# Patient Record
Sex: Male | Born: 1950 | Race: White | Hispanic: No | Marital: Married | Smoking: Former smoker
Health system: Southern US, Community
[De-identification: ages and names within clinical notes are randomized; demographics above are authoritative.]

## PROBLEM LIST (undated history)

## (undated) DIAGNOSIS — K219 Gastro-esophageal reflux disease without esophagitis: Secondary | ICD-10-CM

## (undated) DIAGNOSIS — M1990 Unspecified osteoarthritis, unspecified site: Secondary | ICD-10-CM

## (undated) HISTORY — PX: ANKLE SURGERY: SHX546

## (undated) HISTORY — PX: OTHER SURGICAL HISTORY: SHX169

---

## 2010-01-03 ENCOUNTER — Ambulatory Visit: Payer: Self-pay | Admitting: Internal Medicine

## 2015-01-07 DIAGNOSIS — J302 Other seasonal allergic rhinitis: Secondary | ICD-10-CM | POA: Insufficient documentation

## 2015-01-07 DIAGNOSIS — K644 Residual hemorrhoidal skin tags: Secondary | ICD-10-CM | POA: Insufficient documentation

## 2015-02-25 ENCOUNTER — Emergency Department
Admission: EM | Admit: 2015-02-25 | Discharge: 2015-02-25 | Disposition: A | Discharge status: Home / Self Care | Payer: Federal, State, Local not specified - PPO | Attending: Emergency Medicine | Admitting: Emergency Medicine

## 2015-02-25 ENCOUNTER — Emergency Department: Payer: Federal, State, Local not specified - PPO

## 2015-02-25 ENCOUNTER — Encounter: Payer: Self-pay | Admitting: Emergency Medicine

## 2015-02-25 DIAGNOSIS — S92002A Unspecified fracture of left calcaneus, initial encounter for closed fracture: Secondary | ICD-10-CM | POA: Diagnosis not present

## 2015-02-25 DIAGNOSIS — Y998 Other external cause status: Secondary | ICD-10-CM | POA: Diagnosis not present

## 2015-02-25 DIAGNOSIS — W11XXXA Fall on and from ladder, initial encounter: Secondary | ICD-10-CM | POA: Diagnosis not present

## 2015-02-25 DIAGNOSIS — Y9289 Other specified places as the place of occurrence of the external cause: Secondary | ICD-10-CM | POA: Diagnosis not present

## 2015-02-25 DIAGNOSIS — S39012A Strain of muscle, fascia and tendon of lower back, initial encounter: Secondary | ICD-10-CM | POA: Insufficient documentation

## 2015-02-25 DIAGNOSIS — S99912A Unspecified injury of left ankle, initial encounter: Secondary | ICD-10-CM | POA: Diagnosis present

## 2015-02-25 DIAGNOSIS — Y9389 Activity, other specified: Secondary | ICD-10-CM | POA: Diagnosis not present

## 2015-02-25 DIAGNOSIS — Z880 Allergy status to penicillin: Secondary | ICD-10-CM | POA: Diagnosis not present

## 2015-02-25 MED ORDER — NAPROXEN 500 MG PO TABS: 500.0000 mg | ORAL_TABLET | Freq: Two times a day (BID) | ORAL | Status: DC

## 2015-02-25 MED ORDER — CYCLOBENZAPRINE HCL 10 MG PO TABS: 10.0000 mg | ORAL_TABLET | Freq: Three times a day (TID) | ORAL | Status: DC | PRN

## 2015-02-25 MED ORDER — OXYCODONE-ACETAMINOPHEN 5-325 MG PO TABS: 1.0000 | ORAL_TABLET | Freq: Four times a day (QID) | ORAL | Status: DC | PRN

## 2015-02-25 MED ORDER — OXYCODONE-ACETAMINOPHEN 5-325 MG PO TABS
1.0000 | ORAL_TABLET | Freq: Once | ORAL | Status: AC
  Administered 2015-02-25: 1 via ORAL
  Filled 2015-02-25: qty 1

## 2015-02-25 NOTE — ED Notes (Signed)
Patient discharged to home per MD order. Patient in stable condition, and deemed medically cleared by ED provider for discharge. Discharge instructions reviewed with patient/family using "Teach Back"; verbalized understanding of medication education and administration, and information about follow-up care. Denies further concerns. ° °

## 2015-02-25 NOTE — ED Provider Notes (Signed)
[facility] Emergency Department Provider Note  ____________________________________________  Time seen: Approximately 5:19 PM  I have reviewed the triage vital signs and the nursing notes.   HISTORY  Chief Complaint Fall    HPI Edward Richards is a 64 y.o. male who presents to the emergency department complaining of left ankle pain and left lower back pain status post a fall from a ladder. He states that he was approximately 10 feet up when he fell landing on his left ankle. He states that he then "sat down hard". He states that he is unable to bear weight to the left ankle and the pain is sharp. He has noticed swelling to the medial aspect of the ankle. He states that his lower back pain is best described as an ache. He denies any numbness or tingling in any extremity. He denies hitting his head or losing consciousness. He denies any neck pain. He denies any other injury.   History reviewed. No pertinent past medical history.  There are no active problems to display for this patient.   Past Surgical History  Procedure Laterality Date  . Ankle surgery Right     Current Outpatient Rx  Name  Route  Sig  Dispense  Refill  . cyclobenzaprine (FLEXERIL) 10 MG tablet   Oral   Take 1 tablet (10 mg total) by mouth 3 (three) times daily as needed for muscle spasms.   15 tablet   0   . naproxen (NAPROSYN) 500 MG tablet   Oral   Take 1 tablet (500 mg total) by mouth 2 (two) times daily with a meal.   60 tablet   2   . oxyCODONE-acetaminophen (ROXICET) 5-325 MG tablet   Oral   Take 1 tablet by mouth every 6 (six) hours as needed for severe pain.   20 tablet   0     Allergies Penicillins  No family history on file.  Social History Social History  Substance Use Topics  . Smoking status: Never Smoker   . Smokeless tobacco: None  . Alcohol Use: Yes    Review of Systems Constitutional: No fever/chills Eyes: No visual changes. ENT: No sore  throat. Cardiovascular: Denies chest pain. Respiratory: Denies shortness of breath. Gastrointestinal: No abdominal pain.  No nausea, no vomiting.  No diarrhea.  No constipation. Genitourinary: Negative for dysuria. Musculoskeletal: Endorses low back pain. Endorses left ankle pain. Skin: Negative for rash. Neurological: Negative for headaches, focal weakness or numbness.  10-point ROS otherwise negative.  ____________________________________________   PHYSICAL EXAM:  VITAL SIGNS: ED Triage Vitals  Enc Vitals Group     BP --      Pulse --      Resp --      Temp --      Temp src --      SpO2 --      Weight --      Height --      Head Cir --      Peak Flow --      Pain Score --      Pain Loc --      Pain Edu? --      Excl. in [place]? --     Constitutional: Alert and oriented. Well appearing and in no acute distress. Eyes: Conjunctivae are normal. PERRL. EOMI. Head: Atraumatic. Nose: No congestion/rhinnorhea. Mouth/Throat: Mucous membranes are moist.  Oropharynx non-erythematous. Neck: No stridor.  No cervical spine tenderness to palpation. Cardiovascular: Normal rate, regular rhythm.  Grossly normal heart sounds.  Good peripheral circulation. Respiratory: Normal respiratory effort.  No retractions. Lungs CTAB. Gastrointestinal: Soft and nontender. No distention. No abdominal bruits. No CVA tenderness. Musculoskeletal: No acute abnormality visualized to spine. Patient is nontender to palpation midline over spinal processes. Patient is diffusely tender to palpation over paraspinal muscles in the lumbar region more so on the left. Edema noted to left medial ankle when compared with right. Patient is tender to palpation over medial and lateral malleolus. Range of motion due to pain. Pulses and sensation are intact left lower extremity. Neurologic:  Normal speech and language. No gross focal neurologic deficits are appreciated. No gait instability. Skin:  Skin is warm, dry and intact.  No rash noted. Psychiatric: Mood and affect are normal. Speech and behavior are normal.  ____________________________________________   LABS (all labs ordered are listed, but only abnormal results are displayed)  Labs Reviewed - No data to display ____________________________________________  EKG   ____________________________________________  RADIOLOGY  Left ankle x-ray Impression: Comminuted nondisplaced mid left calcaneus fracture. ____________________________________________   PROCEDURES  Procedure(s) performed: yes, splint, see procedure note(s).   SPLINT APPLICATION Date/Time: 2:40 PM Authorized by: Darletta Moll Consent: Verbal consent obtained. Risks and benefits: risks, benefits and alternatives were discussed Consent given by: patient Splint applied by: Tommy Teer orthopedic technician Location details: L ankle Splint type: Posterior OCL Supplies used: Orthoglass Post-procedure: The splinted body part was neurovascularly unchanged following the procedure. Patient tolerance: Patient tolerated the procedure well with no immediate complications.     Critical Care performed: No  ____________________________________________   INITIAL IMPRESSION / ASSESSMENT AND PLAN / ED COURSE  Pertinent labs & imaging results that were available during my care of the patient were reviewed by me and considered in my medical decision making (see chart for details).  The patient's history, symptoms, physical exam, radiological findings are taken into consideration for diagnosis. The patient has a comminuted nondisplaced calcaneus fracture. The patient was informed of these findings and verbalizes understanding of diagnosis. The patient is placed in a posterior OCL splint. Medications are given for pain control. Patient also is positive for lumbar strain. Patient will be given muscle relaxers and anti-inflammatories for same. She is to follow-up with orthopedics for  further evaluation and treatment of calcaneal fracture. Patient verbalizes understanding of treatment plan and verbalizes compliance with same. ____________________________________________   FINAL CLINICAL IMPRESSION(S) / ED DIAGNOSES  Final diagnoses:  Calcaneal fracture, left, closed, initial encounter  Lumbar strain, initial encounter      Darletta Moll, PA-C 02/25/15 1835  Harvest Dark, MD 02/25/15 2055

## 2015-02-25 NOTE — Discharge Instructions (Signed)
Calcaneal Fracture Repair There are many different ways of treating fractures of the large irregular bone in the foot that makes up the heel of the foot (calcaneus). Calcaneal fractures can be treated with:   Immobilization--The fracture is casted as it is without changing the positions of the fracture involved.   Closed reduction--The bones are manipulated back into position without opening the site of the fracture using surgery.   Open reduction and internal fixation--T Cast or Splint Care Casts and splints support injured limbs and keep bones from moving while they heal. It is important to care for your cast or splint at home.  HOME CARE INSTRUCTIONS Keep the cast or splint uncovered during the drying period. It can take 24 to 48 hours to dry if it is made of plaster. A fiberglass cast will dry in less than 1 hour. Do not rest the cast on anything harder than a pillow for the first 24 hours. Do not put weight on your injured limb or apply pressure to the cast until your health care provider gives you permission. Keep the cast or splint dry. Wet casts or splints can lose their shape and may not support the limb as well. A wet cast that has lost its shape can also create harmful pressure on your skin when it dries. Also, wet skin can become infected. Cover the cast or splint with a plastic bag when bathing or when out in the rain or snow. If the cast is on the trunk of the body, take sponge baths until the cast is removed. If your cast does become wet, dry it with a towel or a blow dryer on the cool setting only. Keep your cast or splint clean. Soiled casts may be wiped with a moistened cloth. Do not place any hard or soft foreign objects under your cast or splint, such as cotton, toilet paper, lotion, or powder. Do not try to scratch the skin under the cast with any object. The object could get stuck inside the cast. Also, scratching could lead to an infection. If itching is a problem, use a  blow dryer on a cool setting to relieve discomfort. Do not trim or cut your cast or remove padding from inside of it. Exercise all joints next to the injury that are not immobilized by the cast or splint. For example, if you have a long leg cast, exercise the hip joint and toes. If you have an arm cast or splint, exercise the shoulder, elbow, thumb, and fingers. Elevate your injured arm or leg on 1 or 2 pillows for the first 1 to 3 days to decrease swelling and pain.It is best if you can comfortably elevate your cast so it is higher than your heart. SEEK MEDICAL CARE IF:  Your cast or splint cracks. Your cast or splint is too tight or too loose. You have unbearable itching inside the cast. Your cast becomes wet or develops a soft spot or area. You have a bad smell coming from inside your cast. You get an object stuck under your cast. Your skin around the cast becomes red or raw. You have new pain or worsening pain after the cast has been applied. SEEK IMMEDIATE MEDICAL CARE IF:  You have fluid leaking through the cast. You are unable to move your fingers or toes. You have discolored (blue or white), cool, painful, or very swollen fingers or toes beyond the cast. You have tingling or numbness around the injured area. You have severe pain or  pressure under the cast. You have any difficulty with your breathing or have shortness of breath. You have chest pain.   This information is not intended to replace advice given to you by your health care provider. Make sure you discuss any questions you have with your health care provider.   Document Released: 04/02/2000 Document Revised: 01/24/2013 Document Reviewed: 10/12/2012 Elsevier Interactive Patient Education [employer].  he fracture site is opened and the bone pieces are fixed into place with some type of hardware (such as a screw).   Primary arthrodesis--The joint has enough damage that a procedure is done as the first treatment  which will leave the joint permanently stiff. This will decrease function, however usually will leave the joint pain free. LET [facility] CARE PROVIDER KNOW ABOUT:  Any allergies you have.   All medicines you are taking, including vitamins, herbs, eye drops, creams, and over-the-counter medicines.   Previous problems you or members of your family have had with the use of anesthetics.  Any blood disorders you have.   Previous surgeries you have had.   Medical conditions you have.  RISKS AND COMPLICATIONS Generally, calcaneal fracture repair is a safe procedure. However, as with any procedure, complications can occur. Possible complications include:   Swelling of the foot and ankle.  Infection of the wound or bone.  Arthritis.  Chronic pain of the foot.  Nerve injury.  Blood clot in the legs or lungs. BEFORE THE PROCEDURE  Ask your health care provider about changing or stopping your regular medicines. You may need to stop taking certain medicines, such as aspirin or blood thinners, at least 1 week before the surgery.  X-rays and any imaging studies are reviewed with your healthcare provider. The surgeon will advise you on the best surgical approach to repair your fracture.  Do not eat or drink anything for at least 8 hours before the surgery or as directed by your health care provider.   If you smoke, do not smoke for at least 2 weeks before the surgery.   Make plans to have someone drive you home after the procedure. Also arrange for someone to help you with activities during recovery.  PROCEDURE   You will be given medicine to help you relax (sedative). You will then be given medicine to make you sleep through the procedure (general anesthetic). These medicines will be given through an IV access tube that is put into one of your veins.   A nerve block or numbing medicine (local anesthetic) may also be used to keep you comfortable.  Once you are asleep, the foot  will be cleaned and shaved if needed.  The surgeon may use a percutaneous or open technique for this surgery:  In the percutaneous approach, small cuts and pins are used to repair the fracture.  In the open technique, a cut is made along the outside of the foot and the bone pieces are placed back together with hardware. A drain may be left to collect fluid. It is removed 3-4 days after the procedure.  The surgeon then uses staples or stitches to close the incision or cuts. AFTER THE PROCEDURE  After surgery you will be taken to the recovery area where a nurse will watch and check your progress for 1-3 hours. Once you are awake, stable, and taking fluids well, and if you do not have any other problems, you will be allowed to go home.  You will be given pain medicine if needed.  The IV access tube will be removed before you are discharged.   This information is not intended to replace advice given to you by your health care provider. Make sure you discuss any questions you have with your health care provider.   Document Released: 01/13/2005 Document Revised: 01/24/2013 Document Reviewed: 11/07/2012 Elsevier Interactive Patient Education [employer].

## 2015-02-25 NOTE — ED Notes (Signed)
Presents s/p fall from ladder   Injury to lower back and left ankle

## 2015-03-04 ENCOUNTER — Other Ambulatory Visit: Payer: Self-pay | Admitting: Orthopedic Surgery

## 2015-03-04 DIAGNOSIS — S92014A Nondisplaced fracture of body of right calcaneus, initial encounter for closed fracture: Secondary | ICD-10-CM

## 2015-03-06 ENCOUNTER — Ambulatory Visit: Admission: RE | Admit: 2015-03-06 | Payer: Federal, State, Local not specified - PPO | Source: Ambulatory Visit

## 2015-03-06 ENCOUNTER — Ambulatory Visit
Admission: RE | Admit: 2015-03-06 | Discharge: 2015-03-06 | Disposition: A | Discharge status: Home / Self Care | Payer: Federal, State, Local not specified - PPO | Source: Ambulatory Visit | Attending: Orthopedic Surgery | Admitting: Orthopedic Surgery

## 2015-03-06 DIAGNOSIS — X58XXXA Exposure to other specified factors, initial encounter: Secondary | ICD-10-CM | POA: Diagnosis not present

## 2015-03-06 DIAGNOSIS — S92015A Nondisplaced fracture of body of left calcaneus, initial encounter for closed fracture: Secondary | ICD-10-CM | POA: Insufficient documentation

## 2015-07-31 ENCOUNTER — Ambulatory Visit (INDEPENDENT_AMBULATORY_CARE_PROVIDER_SITE_OTHER): Payer: No Typology Code available for payment source | Admitting: Podiatry

## 2015-07-31 ENCOUNTER — Encounter: Payer: Self-pay | Admitting: Podiatry

## 2015-07-31 VITALS — BP 134/82 | HR 69 | Resp 12

## 2015-07-31 DIAGNOSIS — L600 Ingrowing nail: Secondary | ICD-10-CM

## 2015-07-31 DIAGNOSIS — L03032 Cellulitis of left toe: Secondary | ICD-10-CM

## 2015-07-31 DIAGNOSIS — L03012 Cellulitis of left finger: Secondary | ICD-10-CM

## 2015-07-31 NOTE — Patient Instructions (Addendum)

## 2015-07-31 NOTE — Progress Notes (Signed)
   Subjective:    Patient ID: Edward Richards, male    DOB: Aug 11, 1950, 65 y.o.   MRN: [id number]  HPI  PT STATED LT FOOT 5TH TOENAIL IS BEEN HURTING FOR 1 WEEK. TOENAIL IS GETTING WORSE WHEN WEARING SHOES. TRIED SOAK-NO RELIEF.  Review of Systems  Musculoskeletal: Positive for myalgias and joint swelling.       Objective:   Physical Exam        Assessment & Plan:

## 2015-08-03 NOTE — Progress Notes (Signed)
Subjective:     Patient ID: Edward Richards, male   DOB: Mar 28, 1951, 65 y.o.   MRN: [id number]  HPI patient presents stating that he has a lot of discomfort in the left fifth toe and that he's been trying to trim it without relief and it's been red and irritated. States it's been hurting for 1-2 weeks   Review of Systems  All other systems reviewed and are negative.      Objective:   Physical Exam  Constitutional: He is oriented to person, place, and time.  Cardiovascular: Intact distal pulses.   Musculoskeletal: Normal range of motion.  Neurological: He is oriented to person, place, and time.  Skin: Skin is warm.  Nursing note and vitals reviewed.  neurovascular status found to be intact with muscle strength adequate range of motion within normal limits. Patient's noted to have good digital perfusion is well oriented 3 with no equinus condition and is noted on the medial side of the left fifth digit there is irritation of tissue with redness and drainage     Assessment:     Localized paronychia infection of the left fifth digit medial side with drainage noted and localized infection    Plan:     H&P and education rendered concerning condition. I've recommended removal of the corner and allowing drainage to occur and today I infiltrated the left fifth digit 60 mg Xylocaine Marcaine mixture and remove the medial border removed all proud flesh abscess tissue and allowing channel for drainage. Gave instructions on soaks and reappoint

## 2015-08-08 ENCOUNTER — Telehealth: Payer: Self-pay | Admitting: *Deleted

## 2015-08-08 NOTE — Telephone Encounter (Signed)
Called patient at [phone] (Home #) to check to see how they were doing from their Paronychia procedure that was performed on Thursday, July 31, 2015. Pt stated, "Done very well. Pain went away the next day".

## 2016-10-08 IMAGING — CT CT FOOT*L* W/O CM
4 series · 13 of 27 positions shown, 15 images · non-contrast
Comparison: None.

CLINICAL DATA: Left calcaneal fracture.  Pre-surgical evaluation.

EXAM:
CT OF THE LEFT FOOT WITHOUT CONTRAST
TECHNIQUE: Multidetector CT imaging of the left foot was performed according to
the standard protocol. Multiplanar CT image reconstructions were
also generated.

[Series 12: soft tissue · axial · 0.52mm/px · z∈[-11,+93]mm · 4 of 88 slices shown]
[im 18/88  soft-tissue]
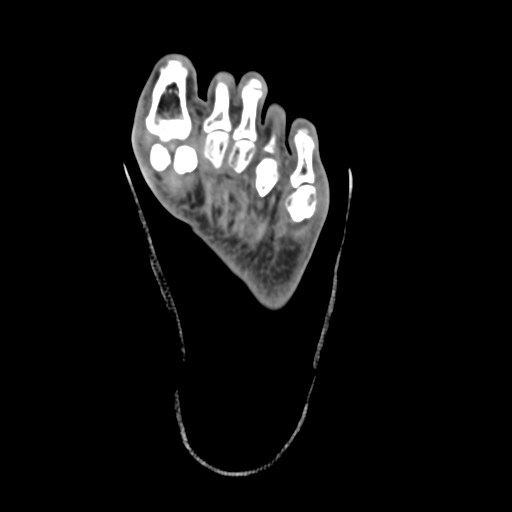
[im 35/88  soft-tissue]
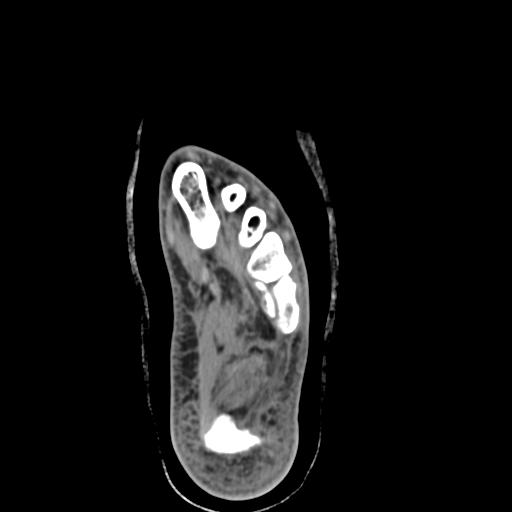
[im 53/88  soft-tissue]
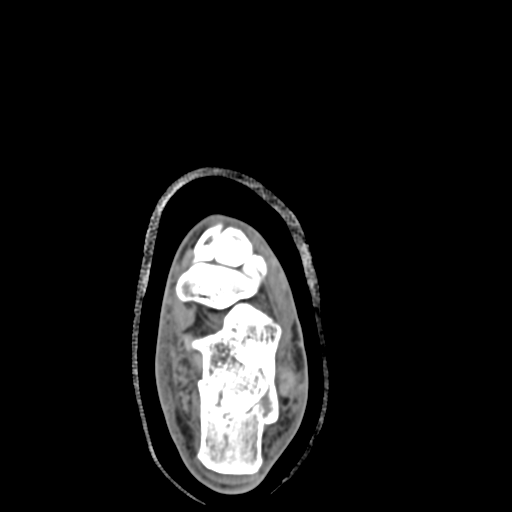
[im 70/88  soft-tissue]
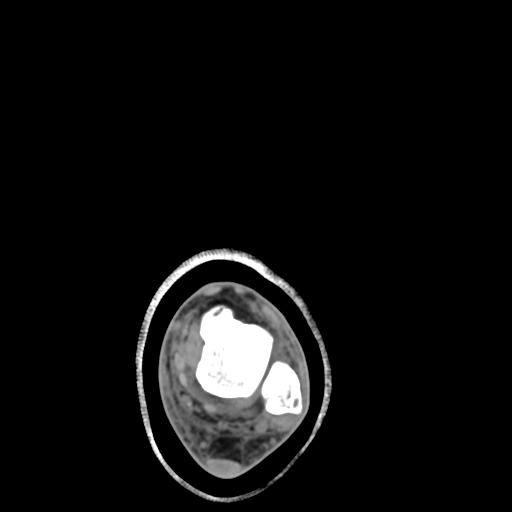

[Series 602: coronal bone · axial · 0.57mm/px · z∈[-49,-6]mm · 2 of 69 slices shown, 3 images]
[im 23/69  soft-tissue]
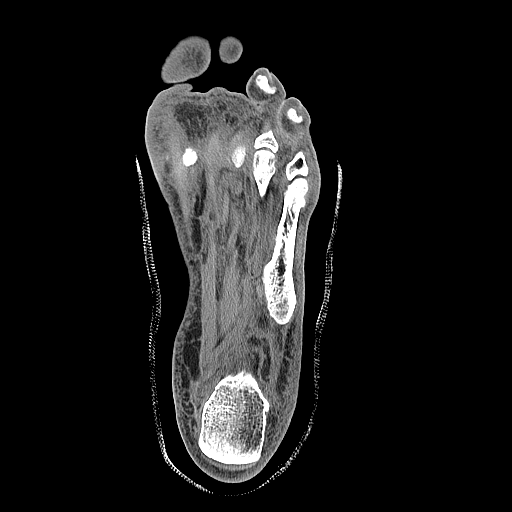
[im 23/69  bone]
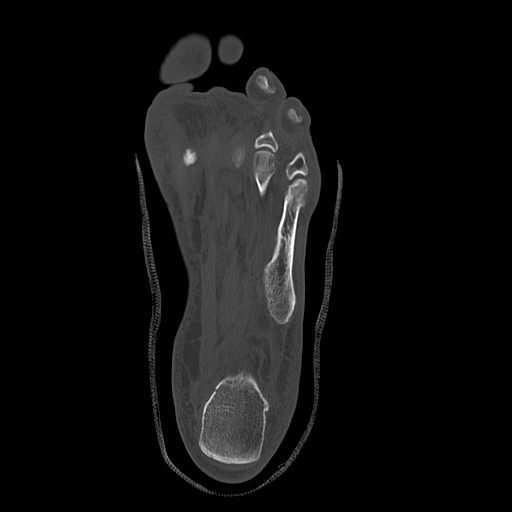
[im 46/69  bone]
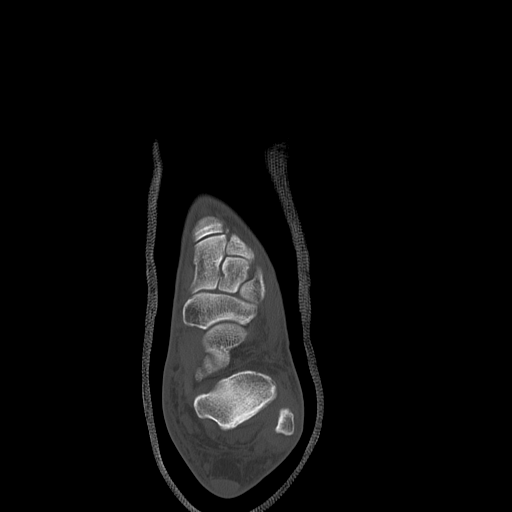

[Series 604: coronal soft tissue · axial · 0.58mm/px · z∈[-25,+20]mm · 2 of 69 slices shown]
[im 23/69  soft-tissue]
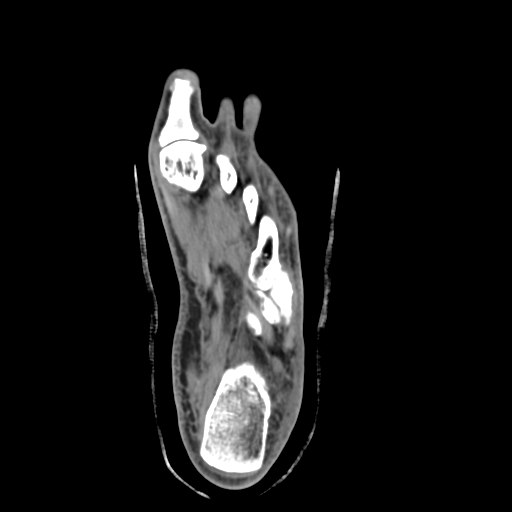
[im 46/69  soft-tissue]
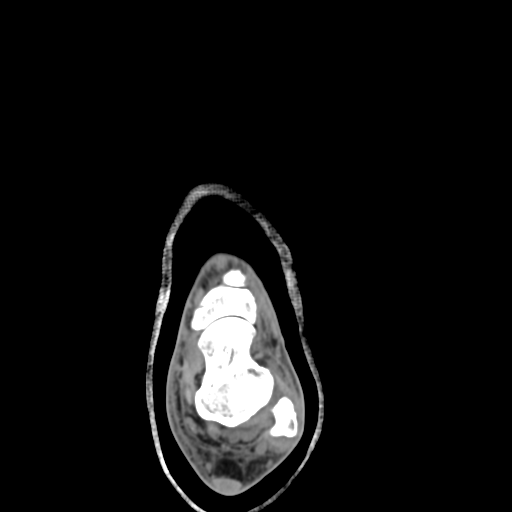

[Series 605: sagittal soft tissue · sagittal · 0.58mm/px · 5 of 53 slices shown, 6 images]
[im 18/53  bone]
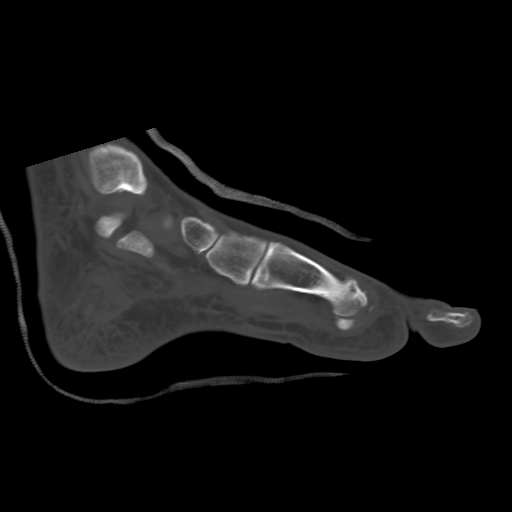
[im 22/53  bone]
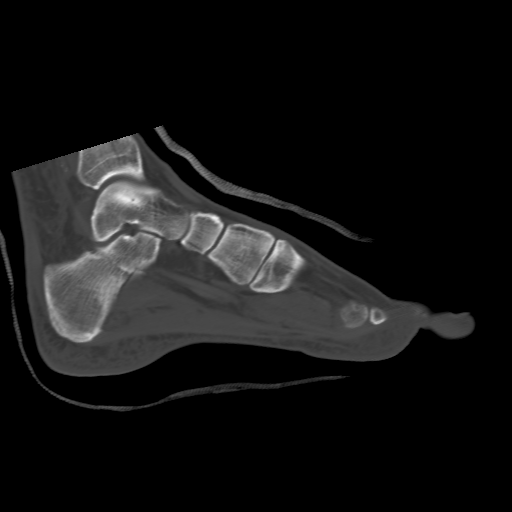
[im 27/53  soft-tissue]
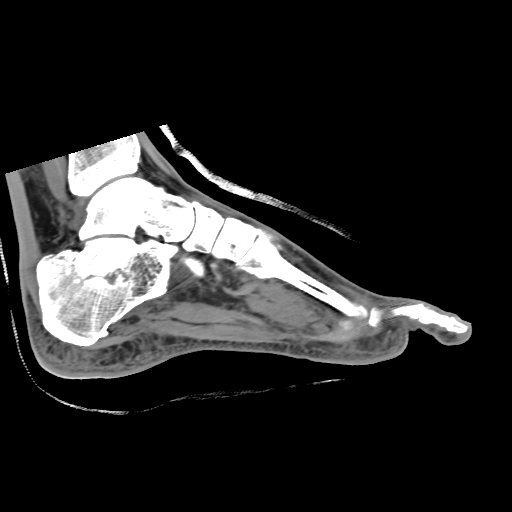
[im 27/53  bone]
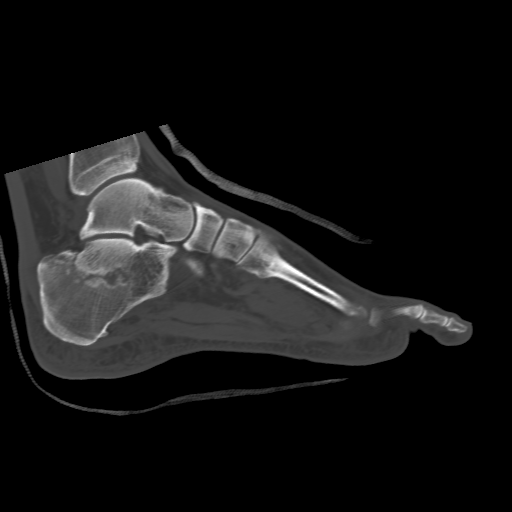
[im 31/53  bone]
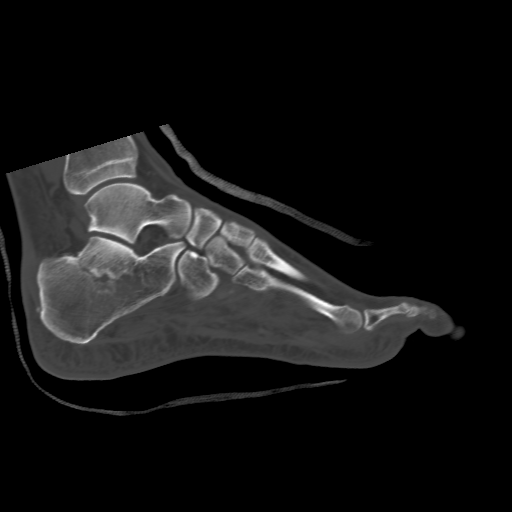
[im 35/53  bone]
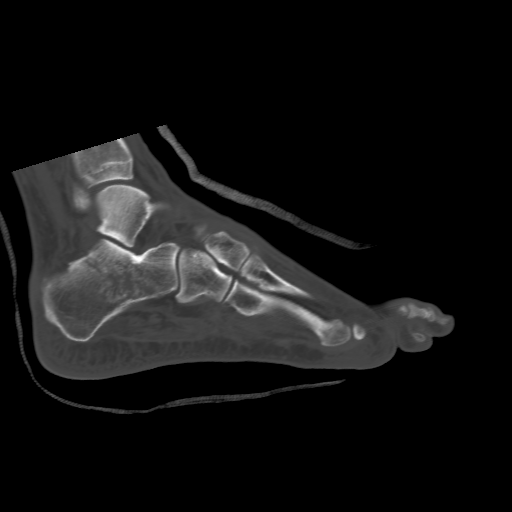

[13 of 27 positions shown; findings below may reference images not displayed]

FINDINGS: There is a comminuted fracture of the body of the calcaneus. The
overall alignment is intact. The subtalar joints are normal. The
ankle mortise is normal.

There is no other fracture or dislocation. There is no lytic or
sclerotic osseous lesion. There is soft tissue swelling around the
hindfoot. The sinus tarsi is normal. The flexor, extensor and
peroneal tendons are grossly intact. The Achilles tendon is intact.
The plantar fascia is grossly intact. There is a tiny plantar
calcaneal spur. There is peripheral vascular atherosclerotic
disease.
IMPRESSION: Comminuted fracture of the body of the calcaneus in near anatomic
alignment.

## 2020-01-17 DIAGNOSIS — R739 Hyperglycemia, unspecified: Secondary | ICD-10-CM | POA: Insufficient documentation

## 2022-04-26 ENCOUNTER — Telehealth: Payer: Self-pay

## 2022-04-26 ENCOUNTER — Other Ambulatory Visit: Payer: Self-pay

## 2022-04-26 DIAGNOSIS — Z1211 Encounter for screening for malignant neoplasm of colon: Secondary | ICD-10-CM

## 2022-04-26 DIAGNOSIS — Z800 Family history of malignant neoplasm of digestive organs: Secondary | ICD-10-CM

## 2022-04-26 MED ORDER — NA SULFATE-K SULFATE-MG SULF 17.5-3.13-1.6 GM/177ML PO SOLN: 1.0000 | Freq: Once | ORAL | 0 refills | Status: AC

## 2022-04-26 NOTE — Telephone Encounter (Signed)
Gastroenterology Pre-Procedure Review  Request Date: 05/13/21 Requesting Physician: Dr. Allen Norris  PATIENT REVIEW QUESTIONS: The patient responded to the following health history questions as indicated:    1. Are you having any GI issues? no 2. Do you have a personal history of Polyps? no 3. Do you have a family history of Colon Cancer or Polyps? yes (mother colon cancer) 4. Diabetes Mellitus? no 5. Joint replacements in the past 12 months?no 6. Major health problems in the past 3 months?no 7. Any artificial heart valves, MVP, or defibrillator?no    MEDICATIONS & ALLERGIES:    Patient reports the following regarding taking any anticoagulation/antiplatelet therapy:   Plavix, Coumadin, Eliquis, Xarelto, Lovenox, Pradaxa, Brilinta, or Effient? no Aspirin? '81mg'$  daily  Patient confirms/reports the following medications:  Current Outpatient Medications  Medication Sig Dispense Refill   aspirin 81 MG chewable tablet Chew by mouth.     Cholecalciferol (VITAMIN D3) 1000 units CAPS Take by mouth.     Multiple Vitamin (MULTI-VITAMINS) TABS Take by mouth.     No current facility-administered medications for this visit.    Patient confirms/reports the following allergies:  Allergies  Allergen Reactions   Penicillins Rash    No orders of the defined types were placed in this encounter.   AUTHORIZATION INFORMATION Primary Insurance: 1D#: Group #:  Secondary Insurance: 1D#: Group #:  SCHEDULE INFORMATION: Date: 05/13/22 Time: Location: [place]

## 2022-04-27 ENCOUNTER — Telehealth: Payer: Self-pay | Admitting: Gastroenterology

## 2022-04-27 DIAGNOSIS — Z1211 Encounter for screening for malignant neoplasm of colon: Secondary | ICD-10-CM

## 2022-04-27 DIAGNOSIS — Z800 Family history of malignant neoplasm of digestive organs: Secondary | ICD-10-CM

## 2022-04-27 NOTE — Telephone Encounter (Signed)
Return call made to patients wife to r/s colonoscopy.  Colonoscopy rescheduled to 06/01/22.  Lattie Haw has been notified of date change.  Instructions update.

## 2022-04-27 NOTE — Telephone Encounter (Signed)
Please call wife.. pt unable to talk due to bronchitis and laryngitis/ Pt want to r/s procedure

## 2022-05-31 ENCOUNTER — Encounter: Payer: Self-pay | Admitting: Gastroenterology

## 2022-06-01 ENCOUNTER — Encounter: Payer: Self-pay | Admitting: Gastroenterology

## 2022-06-01 ENCOUNTER — Encounter
Admission: RE | Disposition: A | Discharge status: Home / Self Care | Payer: Self-pay | Source: Home / Self Care | Attending: Gastroenterology

## 2022-06-01 ENCOUNTER — Ambulatory Visit: Payer: Medicare Other | Admitting: Anesthesiology

## 2022-06-01 ENCOUNTER — Ambulatory Visit
Admission: RE | Admit: 2022-06-01 | Discharge: 2022-06-01 | Disposition: A | Discharge status: Home / Self Care | Payer: Medicare Other | Attending: Gastroenterology | Admitting: Gastroenterology

## 2022-06-01 DIAGNOSIS — D128 Benign neoplasm of rectum: Secondary | ICD-10-CM | POA: Diagnosis not present

## 2022-06-01 DIAGNOSIS — Z87891 Personal history of nicotine dependence: Secondary | ICD-10-CM | POA: Insufficient documentation

## 2022-06-01 DIAGNOSIS — Z1211 Encounter for screening for malignant neoplasm of colon: Secondary | ICD-10-CM | POA: Diagnosis not present

## 2022-06-01 DIAGNOSIS — K635 Polyp of colon: Secondary | ICD-10-CM

## 2022-06-01 DIAGNOSIS — Z800 Family history of malignant neoplasm of digestive organs: Secondary | ICD-10-CM

## 2022-06-01 HISTORY — PX: COLONOSCOPY WITH PROPOFOL: SHX5780

## 2022-06-01 HISTORY — DX: Gastro-esophageal reflux disease without esophagitis: K21.9

## 2022-06-01 HISTORY — DX: Unspecified osteoarthritis, unspecified site: M19.90

## 2022-06-01 SURGERY — COLONOSCOPY WITH PROPOFOL
Anesthesia: General

## 2022-06-01 MED ORDER — LIDOCAINE HCL (PF) 2 % IJ SOLN
INTRAMUSCULAR | Status: AC
  Filled 2022-06-01: qty 5

## 2022-06-01 MED ORDER — PROPOFOL 10 MG/ML IV BOLUS
INTRAVENOUS | Status: DC | PRN
  Administered 2022-06-01: 100 mg via INTRAVENOUS
  Administered 2022-06-01 (×2): 40 mg via INTRAVENOUS

## 2022-06-01 MED ORDER — LIDOCAINE 2% (20 MG/ML) 5 ML SYRINGE
INTRAMUSCULAR | Status: DC | PRN
  Administered 2022-06-01: 50 mg via INTRAVENOUS

## 2022-06-01 MED ORDER — PROPOFOL 1000 MG/100ML IV EMUL
INTRAVENOUS | Status: AC
  Filled 2022-06-01: qty 100

## 2022-06-01 MED ORDER — EPHEDRINE 5 MG/ML INJ
INTRAVENOUS | Status: AC
  Filled 2022-06-01: qty 5

## 2022-06-01 MED ORDER — PROPOFOL 10 MG/ML IV BOLUS
INTRAVENOUS | Status: AC
  Filled 2022-06-01: qty 40

## 2022-06-01 MED ORDER — SODIUM CHLORIDE 0.9 % IV SOLN: INTRAVENOUS | Status: DC

## 2022-06-01 NOTE — Op Note (Signed)
[facility] Gastroenterology Patient Name: Edward Richards Procedure Date: 06/01/2022 7:53 AM MRN: [id number] Account #: [id number] Date of Birth: [birth year] Admit Type: Outpatient Age: 72 Room: [facility] ENDO ROOM 4 Gender: Male Note Status: Finalized Instrument Name: Jasper Riling [id number] Procedure:             Colonoscopy Indications:           Screening for colorectal malignant neoplasm Providers:             Lucilla Lame MD, MD Referring MD:          Lucilla Lame MD, MD (Referring MD), Caprice Renshaw                         MD (Referring MD) Medicines:             Propofol per Anesthesia Complications:         No immediate complications. Procedure:             Pre-Anesthesia Assessment:                        - Prior to the procedure, a History and Physical was                         performed, and patient medications and allergies were                         reviewed. The patient's tolerance of previous                         anesthesia was also reviewed. The risks and benefits                         of the procedure and the sedation options and risks                         were discussed with the patient. All questions were                         answered, and informed consent was obtained. Prior                         Anticoagulants: The patient has taken no anticoagulant                         or antiplatelet agents. ASA Grade Assessment: II - A                         patient with mild systemic disease. After reviewing                         the risks and benefits, the patient was deemed in                         satisfactory condition to undergo the procedure.                        After obtaining informed consent, the colonoscope was  passed under direct vision. Throughout the procedure,                         the patient's blood pressure, pulse, and oxygen                         saturations were monitored continuously.  The                         Colonoscope was introduced through the anus and                         advanced to the the cecum, identified by appendiceal                         orifice and ileocecal valve. The colonoscopy was                         performed without difficulty. The patient tolerated                         the procedure well. The quality of the bowel                         preparation was excellent. Findings:      The perianal and digital rectal examinations were normal.      A 5 mm polyp was found in the rectum. The polyp was sessile. The polyp       was removed with a cold biopsy forceps. Resection and retrieval were       complete.      The exam was otherwise without abnormality. Impression:            - One 5 mm polyp in the rectum, removed with a cold                         biopsy forceps. Resected and retrieved.                        - The examination was otherwise normal. Recommendation:        - Discharge patient to home.                        - Resume previous diet.                        - Continue present medications.                        - Await pathology results.                        - If the pathology report reveals adenomatous tissue,                         then repeat the colonoscopy for surveillance in 7                         years. Procedure Code(s):     --- Professional ---  45380, Colonoscopy, flexible; with biopsy, single or                         multiple Diagnosis Code(s):     --- Professional ---                        Z12.11, Encounter for screening for malignant neoplasm                         of colon                        D12.8, Benign neoplasm of rectum CPT copyright 2022 [employer]. All rights reserved. The codes documented in this report are preliminary and upon coder review may  be revised to meet current compliance requirements. Lucilla Lame MD, MD 06/01/2022 8:17:21 AM This  report has been signed electronically. Number of Addenda: 0 Note Initiated On: 06/01/2022 7:53 AM Scope Withdrawal Time: 0 hours 9 minutes 49 seconds  Total Procedure Duration: 0 hours 14 minutes 6 seconds  Estimated Blood Loss:  Estimated blood loss: none.      [facility]

## 2022-06-01 NOTE — Anesthesia Preprocedure Evaluation (Addendum)
Anesthesia Evaluation  Patient identified by MRN, date of birth, ID band Patient awake    Reviewed: Allergy & Precautions, NPO status , Patient's Chart, lab work & pertinent test results  History of Anesthesia Complications Negative for: history of anesthetic complications  Airway Mallampati: I   Neck ROM: Full    Dental no notable dental hx.    Pulmonary former smoker (quit 40 years ago)   Pulmonary exam normal breath sounds clear to auscultation       Cardiovascular Exercise Tolerance: Good negative cardio ROS Normal cardiovascular exam Rhythm:Regular Rate:Normal     Neuro/Psych negative neurological ROS     GI/Hepatic ,GERD  ,,  Endo/Other  negative endocrine ROS    Renal/GU negative Renal ROS     Musculoskeletal  (+) Arthritis ,    Abdominal   Peds  Hematology negative hematology ROS (+)   Anesthesia Other Findings   Reproductive/Obstetrics                             Anesthesia Physical Anesthesia Plan  ASA: 2  Anesthesia Plan: General   Post-op Pain Management:    Induction: Intravenous  PONV Risk Score and Plan: 2 and Propofol infusion, TIVA and Treatment may vary due to age or medical condition  Airway Management Planned: Natural Airway  Additional Equipment:   Intra-op Plan:   Post-operative Plan:   Informed Consent: I have reviewed the patients History and Physical, chart, labs and discussed the procedure including the risks, benefits and alternatives for the proposed anesthesia with the patient or authorized representative who has indicated his/her understanding and acceptance.       Plan Discussed with: CRNA  Anesthesia Plan Comments: (LMA/GETA backup discussed.  Patient consented for risks of anesthesia including but not limited to:  - adverse reactions to medications - damage to eyes, teeth, lips or other oral mucosa - nerve damage due to positioning   - sore throat or hoarseness - damage to heart, brain, nerves, lungs, other parts of body or loss of life  Informed patient about role of CRNA in peri- and intra-operative care.  Patient voiced understanding.)       Anesthesia Quick Evaluation

## 2022-06-01 NOTE — Anesthesia Postprocedure Evaluation (Signed)
Anesthesia Post Note  Patient: Edward Richards  Procedure(s) Performed: COLONOSCOPY WITH PROPOFOL  Patient location during evaluation: PACU Anesthesia Type: General Level of consciousness: awake and alert, oriented and patient cooperative Pain management: pain level controlled Vital Signs Assessment: post-procedure vital signs reviewed and stable Respiratory status: spontaneous breathing, nonlabored ventilation and respiratory function stable Cardiovascular status: blood pressure returned to baseline and stable Postop Assessment: adequate PO intake Anesthetic complications: no   No notable events documented.   Last Vitals:  Vitals:   06/01/22 0818 06/01/22 0838  BP: 100/73 (!) 118/91  Pulse: 86 73  Resp: 18 18  Temp:    SpO2: 100% 98%    Last Pain:  Vitals:   06/01/22 0838  TempSrc:   PainSc: 0-No pain                 Darrin Nipper

## 2022-06-01 NOTE — Transfer of Care (Signed)
Immediate Anesthesia Transfer of Care Note  Patient: Edward Richards  Procedure(s) Performed: COLONOSCOPY WITH PROPOFOL  Patient Location: Endoscopy Unit  Anesthesia Type:General  Level of Consciousness: awake, alert , and oriented  Airway & Oxygen Therapy: Patient Spontanous Breathing and Patient connected to nasal cannula oxygen  Post-op Assessment: Report given to RN, Post -op Vital signs reviewed and stable, and Patient moving all extremities  Post vital signs: Reviewed and stable  Last Vitals:  Vitals Value Taken Time  BP 100/73 06/01/22 0818  Temp    Pulse 85 06/01/22 0818  Resp 14 06/01/22 0818  SpO2 100 % 06/01/22 0818  Vitals shown include unvalidated device data.  Last Pain:  Vitals:   06/01/22 0818  TempSrc:   PainSc: 0-No pain         Complications: No notable events documented.

## 2022-06-01 NOTE — H&P (Signed)
Lucilla Lame, MD [address] [place], [place] [address] Phone: [phone] Fax : [phone]  Primary Care Physician:  Derinda Late, MD Primary Gastroenterologist:  Dr. Allen Norris  Pre-Procedure History & Physical: HPI:  Edward Richards is a 72 y.o. male is here for a screening colonoscopy.   Past Medical History:  Diagnosis Date   Arthritis    GERD (gastroesophageal reflux disease)     Past Surgical History:  Procedure Laterality Date   ANKLE SURGERY Right    hemorroid banding     right heel surgery      Prior to Admission medications   Medication Sig Start Date End Date Taking? Authorizing Provider  albuterol (VENTOLIN HFA) 108 (90 Base) MCG/ACT inhaler Inhale into the lungs. Patient not taking: Reported on 06/01/2022 04/22/22 04/22/23  [provider]  ascorbic acid (VITAMIN C) 500 MG tablet Take by mouth. Patient not taking: Reported on 06/01/2022    [provider]  aspirin 81 MG chewable tablet Chew by mouth. Patient not taking: Reported on 06/01/2022    [provider]  Cholecalciferol (VITAMIN D3) 1000 units CAPS Take by mouth.    [provider]  guaiFENesin-codeine (ROBITUSSIN AC) 100-10 MG/5ML syrup Take by mouth. Patient not taking: Reported on 06/01/2022 04/22/22   [provider]  Multiple Vitamin (MULTI-VITAMINS) TABS Take by mouth. Patient not taking: Reported on 06/01/2022    [provider]  predniSONE (DELTASONE) 20 MG tablet Take by mouth. Patient not taking: Reported on 06/01/2022 04/22/22   [provider]    Allergies as of 04/27/2022 - Review Complete 07/31/2015  Allergen Reaction Noted   Penicillins Rash 02/25/2015    History reviewed. No pertinent family history.  Social History   Socioeconomic History   Marital status: Married    Spouse name: Not on file   Number of children: Not on file   Years of education: Not on file   Highest education level: Not on file   Occupational History   Not on file  Tobacco Use   Smoking status: Former    Types: Cigarettes   Smokeless tobacco: Not on file  Vaping Use   Vaping Use: Never used  Substance and Sexual Activity   Alcohol use: Yes   Drug use: No   Sexual activity: Not on file  Other Topics Concern   Not on file  Social History Narrative   Not on file   Social Determinants of Health   Financial Resource Strain: Not on file  Food Insecurity: Not on file  Transportation Needs: Not on file  Physical Activity: Not on file  Stress: Not on file  Social Connections: Not on file  Intimate Partner Violence: Not on file    Review of Systems: See HPI, otherwise negative ROS  Physical Exam: BP 114/88   Pulse 78   Temp 97.9 F (36.6 C) (Temporal)   Resp 18   Ht 6' 1"$  (1.854 m)   Wt 85.5 kg   SpO2 96%   BMI 24.86 kg/m  General:   Alert,  pleasant and cooperative in NAD Head:  Normocephalic and atraumatic. Neck:  Supple; no masses or thyromegaly. Lungs:  Clear throughout to auscultation.    Heart:  Regular rate and rhythm. Abdomen:  Soft, nontender and nondistended. Normal bowel sounds, without guarding, and without rebound.   Neurologic:  Alert and  oriented x4;  grossly normal neurologically.  Impression/Plan: Edward Richards is now here to undergo a screening colonoscopy.  Risks,  benefits, and alternatives regarding colonoscopy have been reviewed with the patient.  Questions have been answered.  All parties agreeable.

## 2022-06-02 ENCOUNTER — Encounter: Payer: Self-pay | Admitting: Gastroenterology

## 2022-06-02 LAB — SURGICAL PATHOLOGY

## 2022-06-03 ENCOUNTER — Encounter: Payer: Self-pay | Admitting: Gastroenterology
